# Patient Record
Sex: Female | Born: 1965 | Race: White | Hispanic: No | Smoking: Current some day smoker
Health system: Southern US, Community
[De-identification: ages and names within clinical notes are randomized; demographics above are authoritative.]

## PROBLEM LIST (undated history)

## (undated) DIAGNOSIS — Z8619 Personal history of other infectious and parasitic diseases: Secondary | ICD-10-CM

## (undated) DIAGNOSIS — F419 Anxiety disorder, unspecified: Secondary | ICD-10-CM

## (undated) DIAGNOSIS — T7840XA Allergy, unspecified, initial encounter: Secondary | ICD-10-CM

## (undated) DIAGNOSIS — K219 Gastro-esophageal reflux disease without esophagitis: Secondary | ICD-10-CM

## (undated) HISTORY — DX: Anxiety disorder, unspecified: F41.9

## (undated) HISTORY — DX: Allergy, unspecified, initial encounter: T78.40XA

## (undated) HISTORY — DX: Personal history of other infectious and parasitic diseases: Z86.19

## (undated) HISTORY — DX: Gastro-esophageal reflux disease without esophagitis: K21.9

---

## 2015-11-11 ENCOUNTER — Ambulatory Visit (INDEPENDENT_AMBULATORY_CARE_PROVIDER_SITE_OTHER): Payer: Managed Care, Other (non HMO) | Admitting: Internal Medicine

## 2015-11-11 ENCOUNTER — Encounter: Payer: Self-pay | Admitting: Internal Medicine

## 2015-11-11 VITALS — BP 128/70 | HR 78 | Temp 98.8°F | Ht 68.5 in | Wt 159.5 lb

## 2015-11-11 DIAGNOSIS — F419 Anxiety disorder, unspecified: Secondary | ICD-10-CM

## 2015-11-11 DIAGNOSIS — F329 Major depressive disorder, single episode, unspecified: Secondary | ICD-10-CM

## 2015-11-11 DIAGNOSIS — J302 Other seasonal allergic rhinitis: Secondary | ICD-10-CM | POA: Diagnosis not present

## 2015-11-11 DIAGNOSIS — Z114 Encounter for screening for human immunodeficiency virus [HIV]: Secondary | ICD-10-CM | POA: Diagnosis not present

## 2015-11-11 DIAGNOSIS — Z0000 Encounter for general adult medical examination without abnormal findings: Secondary | ICD-10-CM

## 2015-11-11 DIAGNOSIS — Z23 Encounter for immunization: Secondary | ICD-10-CM | POA: Diagnosis not present

## 2015-11-11 DIAGNOSIS — F32A Depression, unspecified: Secondary | ICD-10-CM

## 2015-11-11 DIAGNOSIS — K219 Gastro-esophageal reflux disease without esophagitis: Secondary | ICD-10-CM

## 2015-11-11 DIAGNOSIS — F418 Other specified anxiety disorders: Secondary | ICD-10-CM

## 2015-11-11 DIAGNOSIS — F411 Generalized anxiety disorder: Secondary | ICD-10-CM | POA: Insufficient documentation

## 2015-11-11 LAB — COMPREHENSIVE METABOLIC PANEL
ALT: 9 U/L (ref 0–35)
AST: 15 U/L (ref 0–37)
Albumin: 4.2 g/dL (ref 3.5–5.2)
Alkaline Phosphatase: 41 U/L (ref 39–117)
BUN: 10 mg/dL (ref 6–23)
CHLORIDE: 105 meq/L (ref 96–112)
CO2: 28 mEq/L (ref 19–32)
CREATININE: 0.81 mg/dL (ref 0.40–1.20)
Calcium: 9.4 mg/dL (ref 8.4–10.5)
GFR: 79.08 mL/min (ref >60.00)
GLUCOSE: 94 mg/dL (ref 70–99)
POTASSIUM: 4.2 meq/L (ref 3.5–5.1)
SODIUM: 137 meq/L (ref 135–145)
Total Bilirubin: 1.2 mg/dL (ref 0.2–1.2)
Total Protein: 7.3 g/dL (ref 6.0–8.3)

## 2015-11-11 LAB — CBC
HEMATOCRIT: 44.2 % (ref 36.0–46.0)
Hemoglobin: 14.7 g/dL (ref 12.0–15.0)
MCHC: 33.2 g/dL (ref 30.0–36.0)
MCV: 102.2 fl — AB (ref 78.0–100.0)
Platelets: 249 10*3/uL (ref 150.0–400.0)
RBC: 4.33 Mil/uL (ref 3.87–5.11)
RDW: 14.7 % (ref 11.5–15.5)
WBC: 5.9 10*3/uL (ref 4.0–10.5)

## 2015-11-11 LAB — LIPID PANEL
CHOL/HDL RATIO: 3
Cholesterol: 161 mg/dL (ref 0–200)
HDL: 56.9 mg/dL (ref >39.00)
LDL CALC: 96 mg/dL (ref 0–99)
NONHDL: 104.01
Triglycerides: 42 mg/dL (ref 0.0–149.0)
VLDL: 8.4 mg/dL (ref 0.0–40.0)

## 2015-11-11 MED ORDER — SERTRALINE HCL 25 MG PO TABS: 25.0000 mg | ORAL_TABLET | Freq: Every day | ORAL | 11 refills | Status: DC

## 2015-11-11 NOTE — Progress Notes (Signed)
HPI  Pt presents to the clinic today to establish care and for management of the conditions listed below. She is transferring care from her GYN Dr. Barnabas ListerWashington. She would like her annual exam today if she can get it.  Seasonal Allergies: Worse in the fall. She takes generic Allegra as needed with good relief.  Anxiety: Triggered by stress of life in general. She has taken Zoloft in the past, and feels like she needs to restart it at this time.  GERD: Triggered by spicy foods. She takes Tums as needed with good relief.  Flu: never Tetanus: > 10 years ago Pap Smear: > 5 years ago Mammogram: never Colon Screening: never Vision Screening: as needed Dentist: as needed  Past Medical History:  Diagnosis Date  . Allergy   . Anxiety   . GERD (gastroesophageal reflux disease)   . History of chicken pox     No current outpatient prescriptions on file.   No current facility-administered medications for this visit.     Allergies  Allergen Reactions  . Codeine Nausea And Vomiting    Family History  Problem Relation Age of Onset  . Alzheimer's disease Mother   . COPD Father   . Thyroid disease Sister   . Stroke Maternal Grandmother     Social History   Social History  . Marital status: Married    Spouse name: N/A  . Number of children: N/A  . Years of education: N/A   Occupational History  . Not on file.   Social History Main Topics  . Smoking status: Current Some Day Smoker    Packs/day: 0.25  . Smokeless tobacco: Never Used  . Alcohol use Yes     Comment: occasional  . Drug use: No  . Sexual activity: Yes   Other Topics Concern  . Not on file   Social History Narrative  . No narrative on file    ROS:  Constitutional: Denies fever, malaise, fatigue, headache or abrupt weight changes.  HEENT: Denies eye pain, eye redness, ear pain, ringing in the ears, wax buildup, runny nose, nasal congestion, bloody nose, or sore throat. Respiratory: Denies difficulty  breathing, shortness of breath, cough or sputum production.   Cardiovascular: Denies chest pain, chest tightness, palpitations or swelling in the hands or feet.  Gastrointestinal: Denies abdominal pain, bloating, constipation, diarrhea or blood in the stool.  GU: Denies frequency, urgency, pain with urination, blood in urine, odor or discharge. Musculoskeletal: Denies decrease in range of motion, difficulty with gait, muscle pain or joint pain and swelling.  Skin: Denies redness, rashes, lesions or ulcercations.  Neurological: Denies dizziness, difficulty with memory, difficulty with speech or problems with balance and coordination.  Psych: Pt reports mild anxiety and depression. Denies SI/HI.  No other specific complaints in a complete review of systems (except as listed in HPI above).  PE:  BP 128/70   Pulse 78   Temp 98.8 F (37.1 C) (Oral)   Ht 5' 8.5" (1.74 m)   Wt 159 lb 8 oz (72.3 kg)   LMP 10/19/2015 (Exact Date)   SpO2 98%   BMI 23.90 kg/m   Wt Readings from Last 3 Encounters:  No data found for Wt    General: Appears her stated age, well developed, well nourished in NAD. HEENT: Head: normal shape and size; Eyes: sclera white, no icterus, conjunctiva pink, PERRLA and EOMs intact; Ears: Tm's gray and intact, normal light reflex; Throat/Mouth: Teeth present, mucosa pink and moist, no lesions or ulcerations  noted.  Neck: Neck supple, trachea midline. No masses, lumps or thyromegaly present.  Cardiovascular: Normal rate and rhythm. S1,S2 noted.  No murmur, rubs or gallops noted. No JVD or BLE edema. No carotid bruits noted. Pulmonary/Chest: Normal effort and positive vesicular breath sounds. No respiratory distress. No wheezes, rales or ronchi noted.  Abdomen: Soft and nontender. Normal bowel sounds. No distention or masses noted. Liver, spleen and kidneys non palpable. Musculoskeletal: Normal range of motion. Strength 5/5 BUE/BLE. No signs of joint swelling. No difficulty with  gait.  Neurological: Alert and oriented. Cranial nerves II-XII grossly intact. Coordination normal.  Psychiatric: Mood and affect normal. Behavior is normal. Judgment and thought content normal.     Assessment and Plan:  Preventative Health Maintenance:  Encouraged her to get a flu shot in the fall Td today Mammogram ordered- she will call [facility] to schedule Will make an appt for her pap smear She declines colonoscopy, IFOB or Cologuard Encouraged her to consume a balanced diet and exercise regimen Encouraged her to see an eye doctor and dentist annually Discussed smoking cessation- she declines at this time Will check CBC, CMET, Lipid and HIV today  RTC in 1 year, sooner if needed Nicki ReaperBAITY, REGINA, NP

## 2015-11-11 NOTE — Assessment & Plan Note (Signed)
Avoid triggers Continue Tums as needed 

## 2015-11-11 NOTE — Progress Notes (Signed)
Pre visit review using our clinic review tool, if applicable. No additional management support is needed unless otherwise documented below in the visit note.   [facility] [place]... Flu--never... TD--more than 10 years... Pap--more than 5 yrs... Mamm--never... Colon--never... Vision--PRN... Dentist--PRN.Marland Kitchen..Marland Kitchen

## 2015-11-11 NOTE — Assessment & Plan Note (Signed)
Continue Allegra prn

## 2015-11-11 NOTE — Patient Instructions (Signed)
Health Maintenance, Female Adopting a healthy lifestyle and getting preventive care can go a long way to promote health and wellness. Talk with your health care provider about what schedule of regular examinations is right for you. This is a good chance for you to check in with your provider about disease prevention and staying healthy. In between checkups, there are plenty of things you can do on your own. Experts have done a lot of research about which lifestyle changes and preventive measures are most likely to keep you healthy. Ask your health care provider for more information. WEIGHT AND DIET  Eat a healthy diet  Be sure to include plenty of vegetables, fruits, low-fat dairy products, and lean protein.  Do not eat a lot of foods high in solid fats, added sugars, or salt.  Get regular exercise. This is one of the most important things you can do for your health.  Most adults should exercise for at least 150 minutes each week. The exercise should increase your heart rate and make you sweat (moderate-intensity exercise).  Most adults should also do strengthening exercises at least twice a week. This is in addition to the moderate-intensity exercise.  Maintain a healthy weight  Body mass index (BMI) is a measurement that can be used to identify possible weight problems. It estimates body fat based on height and weight. Your health care provider can help determine your BMI and help you achieve or maintain a healthy weight.  For females 20 years of age and older:   A BMI below 18.5 is considered underweight.  A BMI of 18.5 to 24.9 is normal.  A BMI of 25 to 29.9 is considered overweight.  A BMI of 30 and above is considered obese.  Watch levels of cholesterol and blood lipids  You should start having your blood tested for lipids and cholesterol at 51 years of age, then have this test every 5 years.  You may need to have your cholesterol levels checked more often if:  Your lipid  or cholesterol levels are high.  You are older than 50 years of age.  You are at high risk for heart disease.  CANCER SCREENING   Lung Cancer  Lung cancer screening is recommended for adults 55-80 years old who are at high risk for lung cancer because of a history of smoking.  A yearly low-dose CT scan of the lungs is recommended for people who:  Currently smoke.  Have quit within the past 15 years.  Have at least a 30-pack-year history of smoking. A pack year is smoking an average of one pack of cigarettes a day for 1 year.  Yearly screening should continue until it has been 15 years since you quit.  Yearly screening should stop if you develop a health problem that would prevent you from having lung cancer treatment.  Breast Cancer  Practice breast self-awareness. This means understanding how your breasts normally appear and feel.  It also means doing regular breast self-exams. Let your health care provider know about any changes, no matter how small.  If you are in your 20s or 30s, you should have a clinical breast exam (CBE) by a health care provider every 1-3 years as part of a regular health exam.  If you are 40 or older, have a CBE every year. Also consider having a breast X-ray (mammogram) every year.  If you have a family history of breast cancer, talk to your health care provider about genetic screening.  If you   are at high risk for breast cancer, talk to your health care provider about having an MRI and a mammogram every year.  Breast cancer gene (BRCA) assessment is recommended for women who have family members with BRCA-related cancers. BRCA-related cancers include:  Breast.  Ovarian.  Tubal.  Peritoneal cancers.  Results of the assessment will determine the need for genetic counseling and BRCA1 and BRCA2 testing. Cervical Cancer Your health care provider may recommend that you be screened regularly for cancer of the pelvic organs (ovaries, uterus, and  vagina). This screening involves a pelvic examination, including checking for microscopic changes to the surface of your cervix (Pap test). You may be encouraged to have this screening done every 3 years, beginning at age 21.  For women ages 30-65, health care providers may recommend pelvic exams and Pap testing every 3 years, or they may recommend the Pap and pelvic exam, combined with testing for human papilloma virus (HPV), every 5 years. Some types of HPV increase your risk of cervical cancer. Testing for HPV may also be done on women of any age with unclear Pap test results.  Other health care providers may not recommend any screening for nonpregnant women who are considered low risk for pelvic cancer and who do not have symptoms. Ask your health care provider if a screening pelvic exam is right for you.  If you have had past treatment for cervical cancer or a condition that could lead to cancer, you need Pap tests and screening for cancer for at least 20 years after your treatment. If Pap tests have been discontinued, your risk factors (such as having a new sexual partner) need to be reassessed to determine if screening should resume. Some women have medical problems that increase the chance of getting cervical cancer. In these cases, your health care provider may recommend more frequent screening and Pap tests. Colorectal Cancer  This type of cancer can be detected and often prevented.  Routine colorectal cancer screening usually begins at 50 years of age and continues through 51 years of age.  Your health care provider may recommend screening at an earlier age if you have risk factors for colon cancer.  Your health care provider may also recommend using home test kits to check for hidden blood in the stool.  A small camera at the end of a tube can be used to examine your colon directly (sigmoidoscopy or colonoscopy). This is done to check for the earliest forms of colorectal  cancer.  Routine screening usually begins at age 50.  Direct examination of the colon should be repeated every 5-10 years through 51 years of age. However, you may need to be screened more often if early forms of precancerous polyps or small growths are found. Skin Cancer  Check your skin from head to toe regularly.  Tell your health care provider about any new moles or changes in moles, especially if there is a change in a mole's shape or color.  Also tell your health care provider if you have a mole that is larger than the size of a pencil eraser.  Always use sunscreen. Apply sunscreen liberally and repeatedly throughout the day.  Protect yourself by wearing long sleeves, pants, a wide-brimmed hat, and sunglasses whenever you are outside. HEART DISEASE, DIABETES, AND HIGH BLOOD PRESSURE   High blood pressure causes heart disease and increases the risk of stroke. High blood pressure is more likely to develop in:  People who have blood pressure in the high end   of the normal range (130-139/85-89 mm Hg).  People who are overweight or obese.  People who are African American.  If you are 38-23 years of age, have your blood pressure checked every 3-5 years. If you are 61 years of age or older, have your blood pressure checked every year. You should have your blood pressure measured twice--once when you are at a hospital or clinic, and once when you are not at a hospital or clinic. Record the average of the two measurements. To check your blood pressure when you are not at a hospital or clinic, you can use:  An automated blood pressure machine at a pharmacy.  A home blood pressure monitor.  If you are between 45 years and 39 years old, ask your health care provider if you should take aspirin to prevent strokes.  Have regular diabetes screenings. This involves taking a blood sample to check your fasting blood sugar level.  If you are at a normal weight and have a low risk for diabetes,  have this test once every three years after 51 years of age.  If you are overweight and have a high risk for diabetes, consider being tested at a younger age or more often. PREVENTING INFECTION  Hepatitis B  If you have a higher risk for hepatitis B, you should be screened for this virus. You are considered at high risk for hepatitis B if:  You were born in a country where hepatitis B is common. Ask your health care provider which countries are considered high risk.  Your parents were born in a high-risk country, and you have not been immunized against hepatitis B (hepatitis B vaccine).  You have HIV or AIDS.  You use needles to inject street drugs.  You live with someone who has hepatitis B.  You have had sex with someone who has hepatitis B.  You get hemodialysis treatment.  You take certain medicines for conditions, including cancer, organ transplantation, and autoimmune conditions. Hepatitis C  Blood testing is recommended for:  Everyone born from [birth year] through 1966.  Anyone with known risk factors for hepatitis C. Sexually transmitted infections (STIs)  You should be screened for sexually transmitted infections (STIs) including gonorrhea and chlamydia if:  You are sexually active and are younger than 51 years of age.  You are older than 51 years of age and your health care provider tells you that you are at risk for this type of infection.  Your sexual activity has changed since you were last screened and you are at an increased risk for chlamydia or gonorrhea. Ask your health care provider if you are at risk.  If you do not have HIV, but are at risk, it may be recommended that you take a prescription medicine daily to prevent HIV infection. This is called pre-exposure prophylaxis (PrEP). You are considered at risk if:  You are sexually active and do not regularly use condoms or know the HIV status of your partner(s).  You take drugs by injection.  You are sexually  active with a partner who has HIV. Talk with your health care provider about whether you are at high risk of being infected with HIV. If you choose to begin PrEP, you should first be tested for HIV. You should then be tested every 3 months for as long as you are taking PrEP.  PREGNANCY   If you are premenopausal and you may become pregnant, ask your health care provider about preconception counseling.  If you may  become pregnant, take 400 to 800 micrograms (mcg) of folic acid every day.  If you want to prevent pregnancy, talk to your health care provider about birth control (contraception). OSTEOPOROSIS AND MENOPAUSE   Osteoporosis is a disease in which the bones lose minerals and strength with aging. This can result in serious bone fractures. Your risk for osteoporosis can be identified using a bone density scan.  If you are 61 years of age or older, or if you are at risk for osteoporosis and fractures, ask your health care provider if you should be screened.  Ask your health care provider whether you should take a calcium or vitamin D supplement to lower your risk for osteoporosis.  Menopause may have certain physical symptoms and risks.  Hormone replacement therapy may reduce some of these symptoms and risks. Talk to your health care provider about whether hormone replacement therapy is right for you.  HOME CARE INSTRUCTIONS   Schedule regular health, dental, and eye exams.  Stay current with your immunizations.   Do not use any tobacco products including cigarettes, chewing tobacco, or electronic cigarettes.  If you are pregnant, do not drink alcohol.  If you are breastfeeding, limit how much and how often you drink alcohol.  Limit alcohol intake to no more than 1 drink per day for nonpregnant women. One drink equals 12 ounces of beer, 5 ounces of wine, or 1 ounces of hard liquor.  Do not use street drugs.  Do not share needles.  Ask your health care provider for help if  you need support or information about quitting drugs.  Tell your health care provider if you often feel depressed.  Tell your health care provider if you have ever been abused or do not feel safe at home.   This information is not intended to replace advice given to you by your health care provider. Make sure you discuss any questions you have with your health care provider.   Document Released: 09/26/2010 Document Revised: 04/03/2014 Document Reviewed: 02/12/2013 Elsevier Interactive Patient Education [employer].

## 2015-11-11 NOTE — Assessment & Plan Note (Signed)
Mild Support offered today Will restart Zoloft

## 2015-11-12 LAB — HIV ANTIBODY (ROUTINE TESTING W REFLEX): HIV 1&2 Ab, 4th Generation: NONREACTIVE

## 2015-11-17 NOTE — Addendum Note (Signed)
Addended by: Roena MaladyEVONTENNO, Avanelle Pixley Y on: 11/17/2015 10:02 AM   Modules accepted: Orders

## 2015-12-08 ENCOUNTER — Other Ambulatory Visit: Payer: Self-pay

## 2015-12-08 MED ORDER — SERTRALINE HCL 25 MG PO TABS: 25.0000 mg | ORAL_TABLET | Freq: Every day | ORAL | 3 refills | Status: DC

## 2015-12-08 NOTE — Telephone Encounter (Signed)
Received a fax from [facility] to change the 11-11-15 rx from #0/11 to 90/3. New rx sent and requested they void out the previous rx

## 2015-12-17 ENCOUNTER — Other Ambulatory Visit: Payer: Self-pay | Admitting: Internal Medicine

## 2015-12-17 DIAGNOSIS — Z1231 Encounter for screening mammogram for malignant neoplasm of breast: Secondary | ICD-10-CM

## 2016-01-06 ENCOUNTER — Encounter: Payer: Self-pay | Admitting: Radiology

## 2016-01-06 ENCOUNTER — Ambulatory Visit
Admission: RE | Admit: 2016-01-06 | Discharge: 2016-01-06 | Disposition: A | Discharge status: Home / Self Care | Payer: Managed Care, Other (non HMO) | Source: Ambulatory Visit | Attending: Internal Medicine | Admitting: Internal Medicine

## 2016-01-06 DIAGNOSIS — Z1231 Encounter for screening mammogram for malignant neoplasm of breast: Secondary | ICD-10-CM | POA: Diagnosis present

## 2016-05-25 ENCOUNTER — Ambulatory Visit: Payer: Managed Care, Other (non HMO) | Admitting: Internal Medicine

## 2017-01-03 ENCOUNTER — Other Ambulatory Visit: Payer: Self-pay | Admitting: Internal Medicine

## 2017-01-03 DIAGNOSIS — Z1231 Encounter for screening mammogram for malignant neoplasm of breast: Secondary | ICD-10-CM

## 2017-01-21 ENCOUNTER — Other Ambulatory Visit: Payer: Self-pay | Admitting: Internal Medicine

## 2017-01-23 NOTE — Telephone Encounter (Signed)
Overdue CPE letter mailed 

## 2017-02-12 ENCOUNTER — Encounter (HOSPITAL_COMMUNITY): Payer: Self-pay

## 2017-02-12 ENCOUNTER — Ambulatory Visit
Admission: RE | Admit: 2017-02-12 | Discharge: 2017-02-12 | Disposition: A | Discharge status: Home / Self Care | Payer: Managed Care, Other (non HMO) | Source: Ambulatory Visit | Attending: Internal Medicine | Admitting: Internal Medicine

## 2017-02-12 DIAGNOSIS — Z1231 Encounter for screening mammogram for malignant neoplasm of breast: Secondary | ICD-10-CM

## 2017-02-28 ENCOUNTER — Other Ambulatory Visit: Payer: Self-pay | Admitting: Internal Medicine

## 2017-03-29 ENCOUNTER — Other Ambulatory Visit: Payer: Self-pay

## 2017-03-29 MED ORDER — SERTRALINE HCL 25 MG PO TABS: 25.0000 mg | ORAL_TABLET | Freq: Every day | ORAL | 0 refills | Status: DC

## 2017-04-04 ENCOUNTER — Encounter: Payer: Managed Care, Other (non HMO) | Admitting: Internal Medicine

## 2017-05-08 ENCOUNTER — Encounter: Payer: Self-pay | Admitting: Internal Medicine

## 2017-05-08 ENCOUNTER — Ambulatory Visit (INDEPENDENT_AMBULATORY_CARE_PROVIDER_SITE_OTHER): Payer: Managed Care, Other (non HMO) | Admitting: Internal Medicine

## 2017-05-08 ENCOUNTER — Other Ambulatory Visit (HOSPITAL_COMMUNITY)
Admission: RE | Admit: 2017-05-08 | Discharge: 2017-05-08 | Disposition: A | Discharge status: Home / Self Care | Payer: Managed Care, Other (non HMO) | Source: Ambulatory Visit | Attending: Internal Medicine | Admitting: Internal Medicine

## 2017-05-08 ENCOUNTER — Encounter: Payer: Managed Care, Other (non HMO) | Admitting: Internal Medicine

## 2017-05-08 VITALS — BP 104/70 | HR 78 | Temp 98.2°F | Ht 68.25 in | Wt 158.5 lb

## 2017-05-08 DIAGNOSIS — Z0000 Encounter for general adult medical examination without abnormal findings: Secondary | ICD-10-CM | POA: Diagnosis not present

## 2017-05-08 DIAGNOSIS — Z1151 Encounter for screening for human papillomavirus (HPV): Secondary | ICD-10-CM | POA: Insufficient documentation

## 2017-05-08 DIAGNOSIS — K219 Gastro-esophageal reflux disease without esophagitis: Secondary | ICD-10-CM

## 2017-05-08 DIAGNOSIS — Z124 Encounter for screening for malignant neoplasm of cervix: Secondary | ICD-10-CM

## 2017-05-08 DIAGNOSIS — E559 Vitamin D deficiency, unspecified: Secondary | ICD-10-CM | POA: Diagnosis not present

## 2017-05-08 DIAGNOSIS — F411 Generalized anxiety disorder: Secondary | ICD-10-CM

## 2017-05-08 LAB — LIPID PANEL
CHOLESTEROL: 204 mg/dL — AB (ref 0–200)
HDL: 57 mg/dL (ref >39.00)
LDL CALC: 122 mg/dL — AB (ref 0–99)
NonHDL: 147.21
Total CHOL/HDL Ratio: 4
Triglycerides: 124 mg/dL (ref 0.0–149.0)
VLDL: 24.8 mg/dL (ref 0.0–40.0)

## 2017-05-08 LAB — COMPREHENSIVE METABOLIC PANEL
ALBUMIN: 4 g/dL (ref 3.5–5.2)
ALT: 9 U/L (ref 0–35)
AST: 15 U/L (ref 0–37)
Alkaline Phosphatase: 43 U/L (ref 39–117)
BILIRUBIN TOTAL: 0.5 mg/dL (ref 0.2–1.2)
BUN: 14 mg/dL (ref 6–23)
CHLORIDE: 103 meq/L (ref 96–112)
CO2: 32 mEq/L (ref 19–32)
CREATININE: 0.83 mg/dL (ref 0.40–1.20)
Calcium: 9.3 mg/dL (ref 8.4–10.5)
GFR: 76.44 mL/min (ref >60.00)
Glucose, Bld: 90 mg/dL (ref 70–99)
Potassium: 4.8 mEq/L (ref 3.5–5.1)
Sodium: 139 mEq/L (ref 135–145)
Total Protein: 6.9 g/dL (ref 6.0–8.3)

## 2017-05-08 LAB — CBC
HEMATOCRIT: 43.2 % (ref 36.0–46.0)
HEMOGLOBIN: 14.4 g/dL (ref 12.0–15.0)
MCHC: 33.4 g/dL (ref 30.0–36.0)
MCV: 102 fl — AB (ref 78.0–100.0)
PLATELETS: 227 10*3/uL (ref 150.0–400.0)
RBC: 4.24 Mil/uL (ref 3.87–5.11)
RDW: 14 % (ref 11.5–15.5)
WBC: 6.7 10*3/uL (ref 4.0–10.5)

## 2017-05-08 LAB — VITAMIN D 25 HYDROXY (VIT D DEFICIENCY, FRACTURES): VITD: 10.7 ng/mL — ABNORMAL LOW (ref 30.00–100.00)

## 2017-05-08 NOTE — Assessment & Plan Note (Signed)
Controlled with TUMS prn

## 2017-05-08 NOTE — Patient Instructions (Signed)

## 2017-05-08 NOTE — Assessment & Plan Note (Signed)
Controlled on Sertraline Support offered today

## 2017-05-08 NOTE — Progress Notes (Signed)
Subjective:    Patient ID: Joan Ramos, female    DOB: 1965/06/11, 52 y.o.   MRN: [id number]  HPI  Pt presents to the clinic today for her annual exam. She is also due to follow up chronic conditions.  GERD: Intermittent. Triggered by spicy foods. She takes Tums as needed.  Anxiety: Chronic but stable on Sertraline. She denies depression, SI/HI.  Flu: 01/2017 Tetanus: 10/2015 Pap Smear: > 5 years ago Mammogram: 01/2017 Colon Screening: never Vision Screening: yearly Dentist: yearly  Diet: She is eat meat. She consumes fruits and veggies daily. She tries to avoid fried foods. She drinks mostly Dr. Reino KentPepper and [employer]. Exercise: None  Review of Systems      Past Medical History:  Diagnosis Date  . Allergy   . Anxiety   . GERD (gastroesophageal reflux disease)   . History of chicken pox     Current Outpatient Medications  Medication Sig Dispense Refill  . sertraline (ZOLOFT) 25 MG tablet Take 1 tablet (25 mg total) by mouth daily. 90 tablet 0   No current facility-administered medications for this visit.     Allergies  Allergen Reactions  . Codeine Nausea And Vomiting    Family History  Problem Relation Age of Onset  . Alzheimer's disease Mother   . COPD Father   . Thyroid disease Sister   . Stroke Maternal Grandmother   . Breast cancer Neg Hx     Social History   Socioeconomic History  . Marital status: Married    Spouse name: Not on file  . Number of children: Not on file  . Years of education: Not on file  . Highest education level: Not on file  Social Needs  . Financial resource strain: Not on file  . Food insecurity - worry: Not on file  . Food insecurity - inability: Not on file  . Transportation needs - medical: Not on file  . Transportation needs - non-medical: Not on file  Occupational History  . Not on file  Tobacco Use  . Smoking status: Current Some Day Smoker    Packs/day: 0.25    Years: 10.00    Pack years: 2.50  .  Smokeless tobacco: Never Used  Substance and Sexual Activity  . Alcohol use: Yes    Comment: occasional  . Drug use: No  . Sexual activity: Yes  Other Topics Concern  . Not on file  Social History Narrative  . Not on file     Constitutional: Denies fever, malaise, fatigue, headache or abrupt weight changes.  HEENT: Denies eye pain, eye redness, ear pain, ringing in the ears, wax buildup, runny nose, nasal congestion, bloody nose, or sore throat. Respiratory: Denies difficulty breathing, shortness of breath, cough or sputum production.   Cardiovascular: Denies chest pain, chest tightness, palpitations or swelling in the hands or feet.  Gastrointestinal: Pt reports intermittent reflux. Denies abdominal pain, bloating, constipation, diarrhea or blood in the stool.  GU: Denies urgency, frequency, pain with urination, burning sensation, blood in urine, odor or discharge. Musculoskeletal: Denies decrease in range of motion, difficulty with gait, muscle pain or joint pain and swelling.  Skin: Denies redness, rashes, lesions or ulcercations.  Neurological: Denies dizziness, difficulty with memory, difficulty with speech or problems with balance and coordination.  Psych: Pt reports anxiety. Denies depression, SI/HI.  No other specific complaints in a complete review of systems (except as listed in HPI above).  Objective:   Physical Exam   BP 104/70  Pulse 78   Temp 98.2 F (36.8 C) (Oral)   Ht 5' 8.25" (1.734 m)   Wt 158 lb 8 oz (71.9 kg)   LMP 04/03/2017   SpO2 98%   BMI 23.92 kg/m  Wt Readings from Last 3 Encounters:  05/08/17 158 lb 8 oz (71.9 kg)  11/11/15 159 lb 8 oz (72.3 kg)    General: Appears her stated age, well developed, well nourished in NAD. Skin: Warm, dry and intact.  HEENT: Head: normal shape and size; Eyes: sclera white, no icterus, conjunctiva pink, PERRLA and EOMs intact; Ears: Tm's gray and intact, normal light reflex; Throat/Mouth: Teeth present, mucosa  pink and moist, no exudate, lesions or ulcerations noted.  Neck:  Neck supple, trachea midline. No masses, lumps or thyromegaly present.  Cardiovascular: Normal rate and rhythm. S1,S2 noted.  No murmur, rubs or gallops noted. No JVD or BLE edema. No carotid bruits noted. Pulmonary/Chest: Normal effort and positive vesicular breath sounds. No respiratory distress. No wheezes, rales or ronchi noted.  Abdomen: Soft and nontender. Normal bowel sounds. No distention or masses noted. Liver, spleen and kidneys non palpable. Pelvic: Normal female anatomy. Cervix without changes. No CMT tenderness, Adnexa non palpable. Musculoskeletal: Strength 5/5 BUE/BLE. No difficulty with gait.  Neurological: Alert and oriented. Cranial nerves II-XII grossly intact. Coordination normal.  Psychiatric: Mood and affect normal. Behavior is normal. Judgment and thought content normal.    BMET    Component Value Date/Time   NA 137 11/11/2015 1022   K 4.2 11/11/2015 1022   CL 105 11/11/2015 1022   CO2 28 11/11/2015 1022   GLUCOSE 94 11/11/2015 1022   BUN 10 11/11/2015 1022   CREATININE 0.81 11/11/2015 1022   CALCIUM 9.4 11/11/2015 1022    Lipid Panel     Component Value Date/Time   CHOL 161 11/11/2015 1022   TRIG 42.0 11/11/2015 1022   HDL 56.90 11/11/2015 1022   CHOLHDL 3 11/11/2015 1022   VLDL 8.4 11/11/2015 1022   LDLCALC 96 11/11/2015 1022    CBC    Component Value Date/Time   WBC 5.9 11/11/2015 1022   RBC 4.33 11/11/2015 1022   HGB 14.7 11/11/2015 1022   HCT 44.2 11/11/2015 1022   PLT 249.0 11/11/2015 1022   MCV 102.2 (H) 11/11/2015 1022   MCHC 33.2 11/11/2015 1022   RDW 14.7 11/11/2015 1022    Hgb A1C No results found for: HGBA1C         Assessment & Plan:   Preventative Health Maintenance:  Flu shot UTD Tetanus UTD Pap smear today, she declines STD screening She will schedule her mammogram in 01/2018 She declines colonoscopy but will call her insurance to see if Cologuard is  covered Encouraged her to consume a balanced diet and exercise regimen Advised her to see an eye doctor and dentist annually Will check CBC, CMET, Lipid, and Vit D today  RTC in 1 year, sooner if needed Nicki Reaper, NP

## 2017-05-09 LAB — CYTOLOGY - PAP
Diagnosis: NEGATIVE
HPV: NOT DETECTED

## 2017-05-14 ENCOUNTER — Other Ambulatory Visit: Payer: Self-pay | Admitting: Internal Medicine

## 2017-05-14 DIAGNOSIS — E559 Vitamin D deficiency, unspecified: Secondary | ICD-10-CM

## 2017-05-14 MED ORDER — VITAMIN D (ERGOCALCIFEROL) 1.25 MG (50000 UNIT) PO CAPS: 50000.0000 [IU] | ORAL_CAPSULE | ORAL | 0 refills | Status: DC

## 2017-05-16 NOTE — Addendum Note (Signed)
Addended by: Roena MaladyEVONTENNO, MELANIE Y on: 05/16/2017 11:33 AM   Modules accepted: Orders

## 2017-07-25 ENCOUNTER — Other Ambulatory Visit: Payer: Self-pay | Admitting: Internal Medicine

## 2017-07-31 ENCOUNTER — Other Ambulatory Visit: Payer: Self-pay | Admitting: Internal Medicine

## 2017-08-09 ENCOUNTER — Other Ambulatory Visit: Payer: Self-pay | Admitting: Internal Medicine

## 2017-08-15 ENCOUNTER — Other Ambulatory Visit (INDEPENDENT_AMBULATORY_CARE_PROVIDER_SITE_OTHER): Payer: Managed Care, Other (non HMO)

## 2017-08-15 DIAGNOSIS — E559 Vitamin D deficiency, unspecified: Secondary | ICD-10-CM | POA: Diagnosis not present

## 2017-08-15 LAB — VITAMIN D 25 HYDROXY (VIT D DEFICIENCY, FRACTURES): VITD: 41.14 ng/mL (ref 30.00–100.00)

## 2018-02-06 ENCOUNTER — Other Ambulatory Visit: Payer: Self-pay | Admitting: Internal Medicine

## 2018-02-06 DIAGNOSIS — Z1231 Encounter for screening mammogram for malignant neoplasm of breast: Secondary | ICD-10-CM

## 2018-03-06 ENCOUNTER — Ambulatory Visit
Admission: RE | Admit: 2018-03-06 | Discharge: 2018-03-06 | Disposition: A | Discharge status: Home / Self Care | Payer: Managed Care, Other (non HMO) | Source: Ambulatory Visit | Attending: Internal Medicine | Admitting: Internal Medicine

## 2018-03-06 DIAGNOSIS — Z1231 Encounter for screening mammogram for malignant neoplasm of breast: Secondary | ICD-10-CM | POA: Diagnosis present

## 2018-06-04 ENCOUNTER — Other Ambulatory Visit: Payer: Self-pay | Admitting: Internal Medicine

## 2018-10-08 ENCOUNTER — Encounter: Payer: Self-pay | Admitting: Internal Medicine

## 2018-10-08 ENCOUNTER — Other Ambulatory Visit: Payer: Self-pay

## 2018-10-08 ENCOUNTER — Ambulatory Visit: Payer: PRIVATE HEALTH INSURANCE | Admitting: Internal Medicine

## 2018-10-08 VITALS — BP 104/70 | HR 88 | Temp 98.6°F | Wt 163.0 lb

## 2018-10-08 DIAGNOSIS — F411 Generalized anxiety disorder: Secondary | ICD-10-CM | POA: Diagnosis not present

## 2018-10-08 DIAGNOSIS — M79621 Pain in right upper arm: Secondary | ICD-10-CM

## 2018-10-08 MED ORDER — SERTRALINE HCL 25 MG PO TABS: 25.0000 mg | ORAL_TABLET | Freq: Every day | ORAL | 0 refills | Status: DC

## 2018-10-08 MED ORDER — METHOCARBAMOL 500 MG PO TABS: 250.0000 mg | ORAL_TABLET | Freq: Every evening | ORAL | 0 refills | Status: AC | PRN

## 2018-10-08 NOTE — Progress Notes (Signed)
Subjective:    Patient ID: Joan Ramos, female    DOB: [birth year], 53 y.o.   MRN: [id number]  HPI  Patient presents to the clinic today for a medication follow up for depression.      GERD:  Not currently an issue. She is not taking anything OTC for this.  Anxiety:  Currently taking Sertraline as prescribed.  Denies SI/HI.  She does not see a psychiatrist.  Vitamin D Deficiency:  She is currently taking Vitamin D supplementation OTC along with a daily women's Multivitamin.  She also complains of right arm pain x 2 weeks.  She describes the pain as throbbing and inhibits her sleep.  She has tried OTC muscle rub without any improvement.  She works in a [employer] and does repetitive work with her right arm. She denies any injury that she is aware of.   Review of Systems      Past Medical History:  Diagnosis Date  . Allergy   . Anxiety   . GERD (gastroesophageal reflux disease)   . History of chicken pox     Current Outpatient Medications  Medication Sig Dispense Refill  . sertraline (ZOLOFT) 25 MG tablet Take 1 tablet (25 mg total) by mouth daily. MUST SCHEDULE PHYSICAL EXAM 90 tablet 0  . Vitamin D, Ergocalciferol, (DRISDOL) 50000 units CAPS capsule Take 1 capsule (50,000 Units total) by mouth every 7 (seven) days. 12 capsule 0   No current facility-administered medications for this visit.     Allergies  Allergen Reactions  . Codeine Nausea And Vomiting    Family History  Problem Relation Age of Onset  . Alzheimer's disease Mother   . COPD Father   . Thyroid disease Sister   . Stroke Maternal Grandmother   . Breast cancer Neg Hx     Social History   Socioeconomic History  . Marital status: Married    Spouse name: Not on file  . Number of children: Not on file  . Years of education: Not on file  . Highest education level: Not on file  Occupational History  . Not on file  Social Needs  . Financial resource strain: Not on file  . Food insecurity   Worry: Not on file    Inability: Not on file  . Transportation needs    Medical: Not on file    Non-medical: Not on file  Tobacco Use  . Smoking status: Current Some Day Smoker    Packs/day: 0.25    Years: 10.00    Pack years: 2.50  . Smokeless tobacco: Never Used  Substance and Sexual Activity  . Alcohol use: Yes    Comment: occasional  . Drug use: No  . Sexual activity: Yes  Lifestyle  . Physical activity    Days per week: Not on file    Minutes per session: Not on file  . Stress: Not on file  Relationships  . Social Herbalist on phone: Not on file    Gets together: Not on file    Attends religious service: Not on file    Active member of club or organization: Not on file    Attends meetings of clubs or organizations: Not on file    Relationship status: Not on file  . Intimate partner violence    Fear of current or ex partner: Not on file    Emotionally abused: Not on file    Physically abused: Not on file    Forced sexual  activity: Not on file  Other Topics Concern  . Not on file  Social History Narrative  . Not on file     Constitutional: Denies fever, malaise, fatigue, headache or abrupt weight changes.  Musculoskeletal: Positive for right upper arm pain with movement.  She reports decreased ROM due to pain. Skin: Denies redness, rashes, lesions or ulcercations.  Psych: Has history of panic attacks.  Denies depression, SI/HI.  No other specific complaints in a complete review of systems (except as listed in HPI above).  Objective:   Physical Exam  BP 104/70 (BP Location: Left Arm, Patient Position: Sitting, Cuff Size: Normal)   Pulse 88   Temp 98.6 F (37 C)   Wt 163 lb (73.9 kg)   SpO2 97%   BMI 24.60 kg/m    Wt Readings from Last 3 Encounters:  05/08/17 158 lb 8 oz (71.9 kg)  11/11/15 159 lb 8 oz (72.3 kg)    General: Appears her stated age, well developed, well nourished in NAD. Skin: Warm, dry and intact. No rashes, lesions or  ulcerations noted. Musculoskeletal: Pain with ROM of RUE.  Pain with palpation of the right upper extremity. Psychiatric: Mood and affect normal. Behavior is normal. Judgment and thought content normal.    BMET    Component Value Date/Time   NA 139 05/08/2017 1435   K 4.8 05/08/2017 1435   CL 103 05/08/2017 1435   CO2 32 05/08/2017 1435   GLUCOSE 90 05/08/2017 1435   BUN 14 05/08/2017 1435   CREATININE 0.83 05/08/2017 1435   CALCIUM 9.3 05/08/2017 1435    Lipid Panel     Component Value Date/Time   CHOL 204 (H) 05/08/2017 1435   TRIG 124.0 05/08/2017 1435   HDL 57.00 05/08/2017 1435   CHOLHDL 4 05/08/2017 1435   VLDL 24.8 05/08/2017 1435   LDLCALC 122 (H) 05/08/2017 1435    CBC    Component Value Date/Time   WBC 6.7 05/08/2017 1435   RBC 4.24 05/08/2017 1435   HGB 14.4 05/08/2017 1435   HCT 43.2 05/08/2017 1435   PLT 227.0 05/08/2017 1435   MCV 102.0 (H) 05/08/2017 1435   MCHC 33.4 05/08/2017 1435   RDW 14.0 05/08/2017 1435    Hgb A1C No results found for: HGBA1C          Assessment & Plan:   Right Upper Extremity Pain:  Rx for Robaxin 250 mg PO QHS prn. Encouraged use of heat, Ibuprofen, and Tylenol for symptom management.  Anxiety:    Continue on current dose of Sertraline.  Refill sent to pharmacy.  Support offered today.  GERD:  Currently not an issue Will monitor  Vit D Def:  Continue Vit D OTC Will recheck Vit D level at annual exam  Return precautions discussed.  Nicki Reaperegina Liberta Gimpel, NP

## 2018-10-08 NOTE — Patient Instructions (Signed)
Muscle Strain A muscle strain is an injury that happens when a muscle is stretched longer than normal. This can happen during a fall, sports, or lifting. This can tear some muscle fibers. Usually, recovery from muscle strain takes 1-2 weeks. Complete healing normally takes 5-6 weeks. This condition is first treated with PRICE therapy. This involves:  Protecting your muscle from being injured again.  Resting your injured muscle.  Icing your injured muscle.  Applying pressure (compression) to your injured muscle. This may be done with a splint or elastic bandage.  Raising (elevating) your injured muscle. Your doctor may also recommend medicine for pain. Follow these instructions at home: If you have a splint:  Wear the splint as told by your doctor. Take it off only as told by your doctor.  Loosen the splint if your fingers or toes tingle, get numb, or turn cold and blue.  Keep the splint clean.  If the splint is not waterproof: ? Do not let it get wet. ? Cover it with a watertight covering when you take a bath or a shower. Managing pain, stiffness, and swelling   If directed, put ice on your injured area. ? If you have a removable splint, take it off as told by your doctor. ? Put ice in a plastic bag. ? Place a towel between your skin and the bag. ? Leave the ice on for 20 minutes, 2-3 times a day.  Move your fingers or toes often. This helps to avoid stiffness and lessen swelling.  Raise your injured area above the level of your heart while you are sitting or lying down.  Wear an elastic bandage as told by your doctor. Make sure it is not too tight. General instructions  Take over-the-counter and prescription medicines only as told by your doctor.  Limit your activity. Rest your injured muscle as told by your doctor. Your doctor may say that gentle movements are okay.  If physical therapy was prescribed, do exercises as told by your doctor.  Do not put pressure on any  part of the splint until it is fully hardened. This may take many hours.  Do not use any products that contain nicotine or tobacco, such as cigarettes and e-cigarettes. These can delay bone healing. If you need help quitting, ask your doctor.  Warm up before you exercise. This helps to prevent more muscle strains.  Ask your doctor when it is safe to drive if you have a splint.  Keep all follow-up visits as told by your doctor. This is important. Contact a doctor if:  You have more pain or swelling in your injured area. Get help right away if:  You have any of these problems in your injured area: ? You have numbness. ? You have tingling. ? You lose a lot of strength. Summary  A muscle strain is an injury that happens when a muscle is stretched longer than normal.  This condition is first treated with PRICE therapy. This includes protecting, resting, icing, adding pressure, and raising your injury.  Limit your activity. Rest your injured muscle as told by your doctor. Your doctor may say that gentle movements are okay.  Warm up before you exercise. This helps to prevent more muscle strains. This information is not intended to replace advice given to you by your health care provider. Make sure you discuss any questions you have with your health care provider. Document Released: 12/21/2007 Document Revised: 05/09/2018 Document Reviewed: 04/19/2016 Elsevier Patient Education  2020 [employer]  

## 2018-12-10 ENCOUNTER — Encounter: Payer: PRIVATE HEALTH INSURANCE | Admitting: Internal Medicine

## 2019-01-04 ENCOUNTER — Other Ambulatory Visit: Payer: Self-pay | Admitting: Internal Medicine

## 2019-01-09 ENCOUNTER — Encounter: Payer: PRIVATE HEALTH INSURANCE | Admitting: Internal Medicine

## 2019-02-17 IMAGING — MG DIGITAL SCREENING BILATERAL MAMMOGRAM WITH TOMO AND CAD
6 of 10 series · 6 of 30 positions shown · non-contrast
Comparison: Previous exam(s).

CLINICAL DATA: Screening.

EXAM:
DIGITAL SCREENING BILATERAL MAMMOGRAM WITH TOMO AND CAD

[L MLO synth-2D]
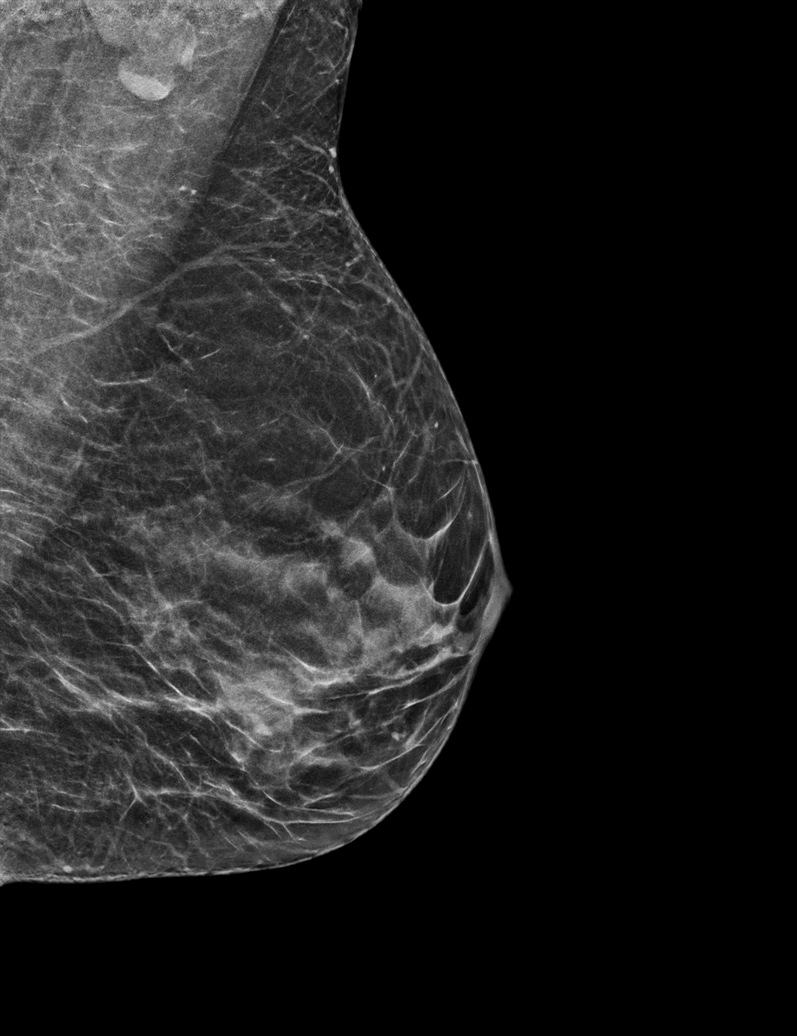

[R CC synth-2D (1 of 2)]
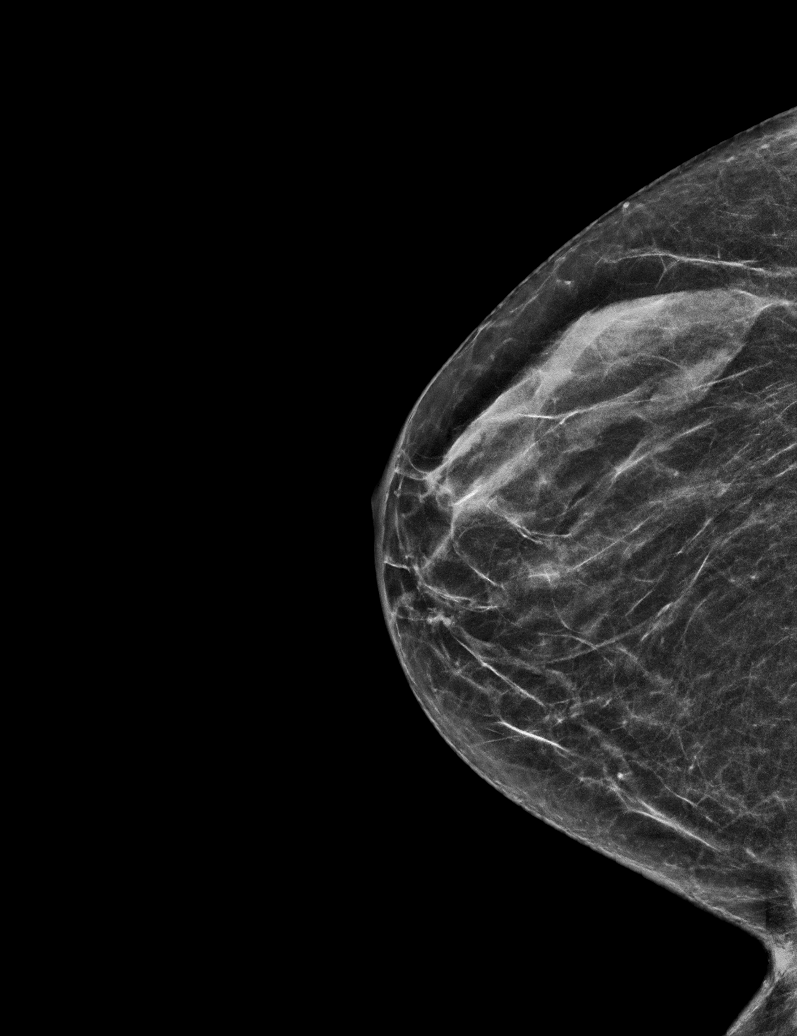

[L CC synth-2D]
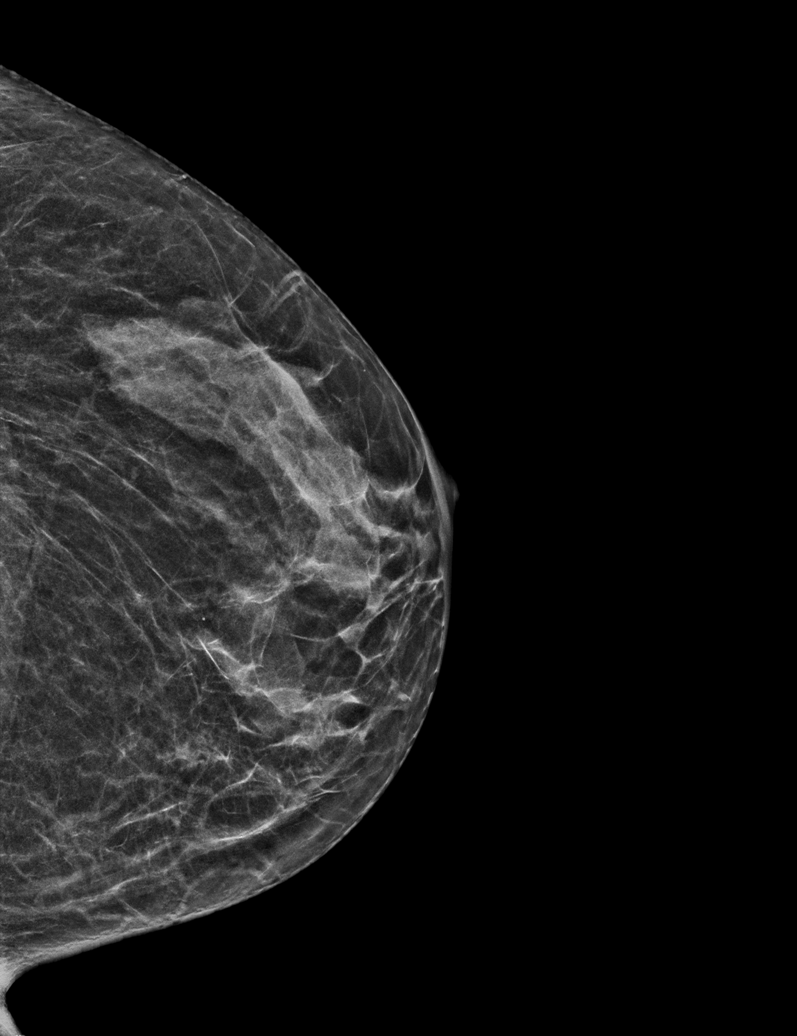

[R MLO synth-2D]
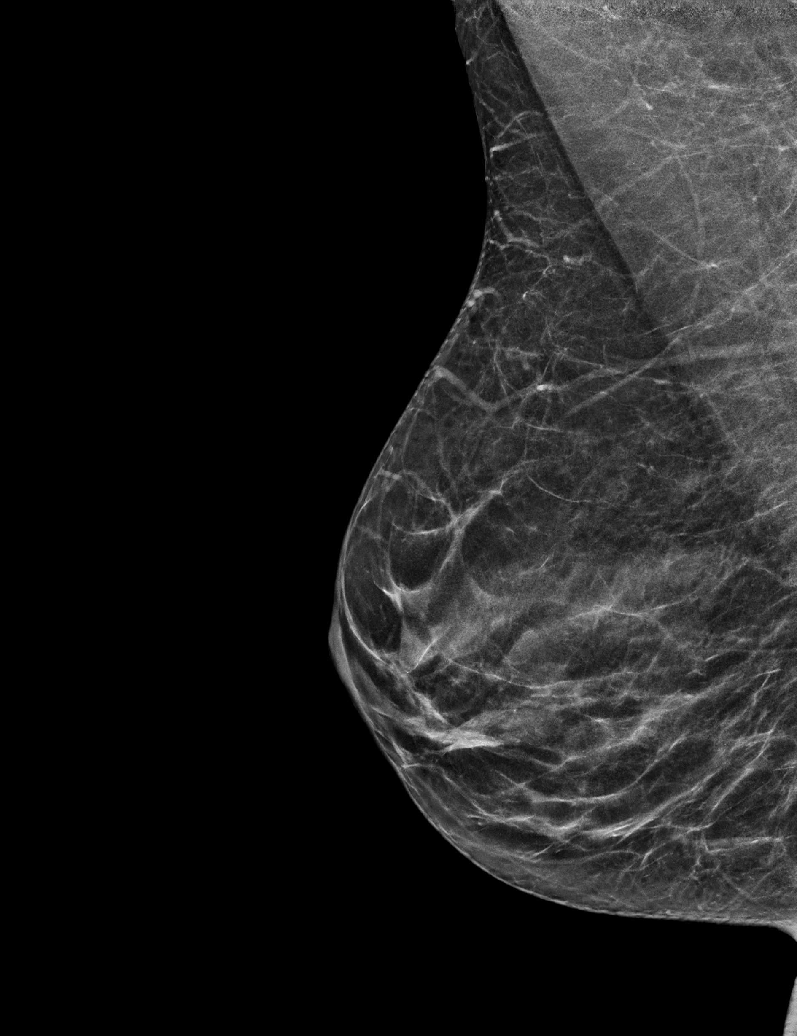

[R CC synth-2D (2 of 2)]
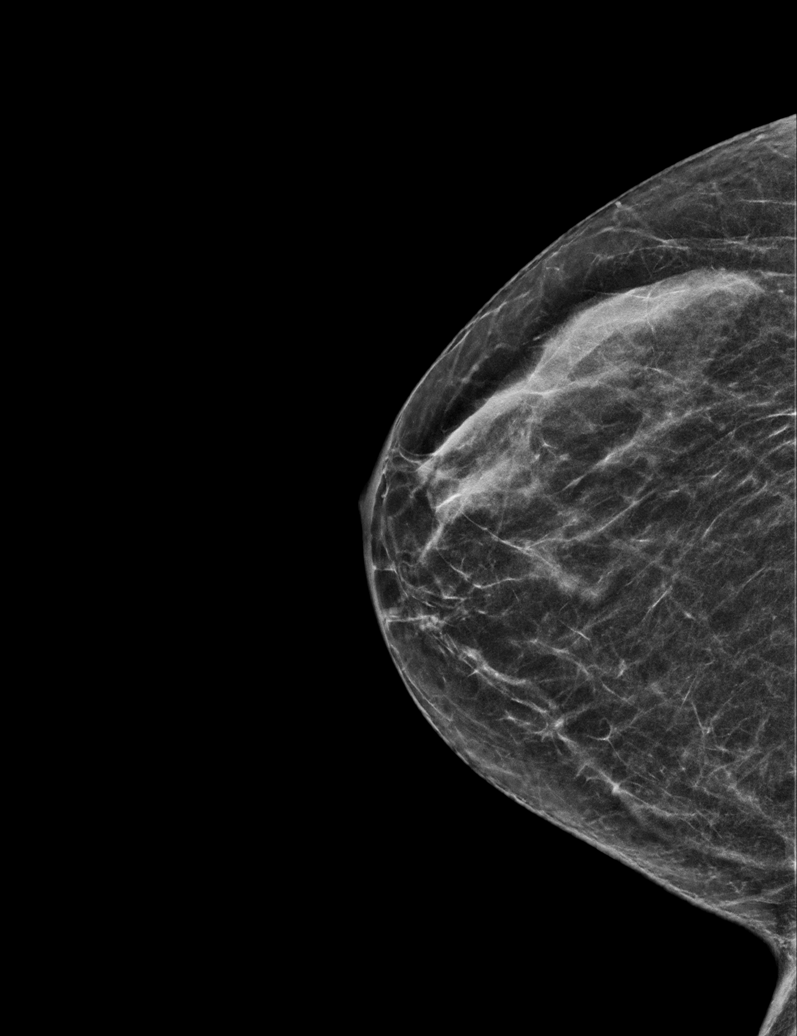

[R MLO tomo · tomo slice 23/46.0]
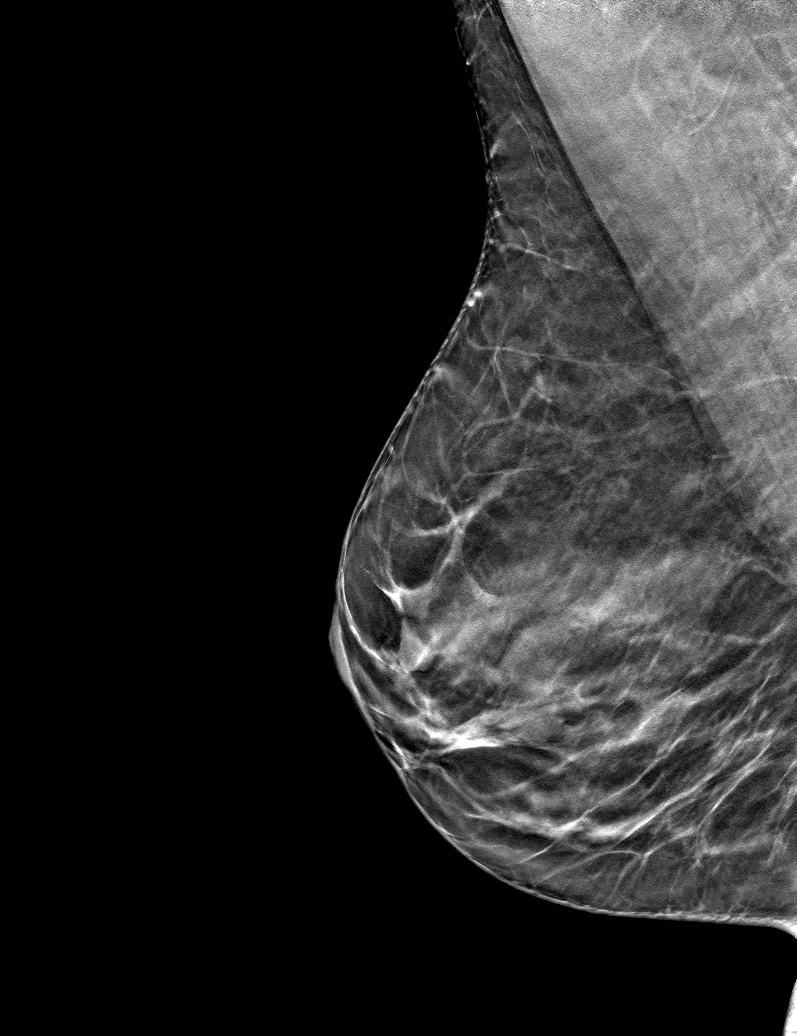

[6 of 30 positions shown; findings below may reference images not displayed]

ACR Breast Density Category b: There are scattered areas of
fibroglandular density.
FINDINGS: There are no findings suspicious for malignancy. Images were
processed with CAD.
IMPRESSION: No mammographic evidence of malignancy. A result letter of this
screening mammogram will be mailed directly to the patient.

RECOMMENDATION:
Screening mammogram in one year. (Code:[id number])

BI-RADS CATEGORY  1: Negative.

## 2019-03-30 ENCOUNTER — Other Ambulatory Visit: Payer: Self-pay | Admitting: Internal Medicine

## 2019-06-25 ENCOUNTER — Other Ambulatory Visit: Payer: Self-pay | Admitting: Internal Medicine

## 2019-07-19 ENCOUNTER — Ambulatory Visit: Payer: PRIVATE HEALTH INSURANCE | Attending: Internal Medicine

## 2019-07-19 DIAGNOSIS — Z23 Encounter for immunization: Secondary | ICD-10-CM

## 2019-07-19 NOTE — Progress Notes (Signed)
   Covid-19 Vaccination Clinic  Name:  IRELYNN SCHERMERHORN    MRN: [id number] DOB: May 21, 1965  07/19/2019  Ms. Blomberg was observed post Covid-19 immunization for 15 minutes without incident. She was provided with Vaccine Information Sheet and instruction to access the V-Safe system.   Ms. Frankenfield was instructed to call 911 with any severe reactions post vaccine: Marland Kitchen Difficulty breathing  . Swelling of face and throat  . A fast heartbeat  . A bad rash all over body  . Dizziness and weakness   Immunizations Administered    Name Date Dose VIS Date Route   Pfizer COVID-19 Vaccine 07/19/2019 11:44 AM 0.3 mL 05/21/2018 Intramuscular   Manufacturer: [employer], [employer]   Lot: [id number]   NDC: [address]-9

## 2019-08-12 ENCOUNTER — Ambulatory Visit: Payer: PRIVATE HEALTH INSURANCE | Attending: Internal Medicine

## 2019-08-12 DIAGNOSIS — Z23 Encounter for immunization: Secondary | ICD-10-CM

## 2019-08-12 NOTE — Progress Notes (Signed)
   Covid-19 Vaccination Clinic  Name:  Joan Ramos    MRN: [id number] DOB: February 03, 1966  08/12/2019  Joan Ramos was observed post Covid-19 immunization for 15 minutes without incident. She was provided with Vaccine Information Sheet and instruction to access the V-Safe system.   Joan Ramos was instructed to call 911 with any severe reactions post vaccine: Marland Kitchen Difficulty breathing  . Swelling of face and throat  . A fast heartbeat  . A bad rash all over body  . Dizziness and weakness   Immunizations Administered    Name Date Dose VIS Date Route   Pfizer COVID-19 Vaccine 08/12/2019  2:16 PM 0.3 mL 05/21/2018 Intramuscular   Manufacturer: [employer], [employer]   Lot: [id number]   NDC: [address]-4

## 2020-01-07 ENCOUNTER — Other Ambulatory Visit: Payer: Self-pay | Admitting: Internal Medicine

## 2020-01-07 DIAGNOSIS — Z1231 Encounter for screening mammogram for malignant neoplasm of breast: Secondary | ICD-10-CM

## 2020-02-10 ENCOUNTER — Ambulatory Visit
Admission: RE | Admit: 2020-02-10 | Discharge: 2020-02-10 | Disposition: A | Discharge status: Home / Self Care | Payer: PRIVATE HEALTH INSURANCE | Source: Ambulatory Visit | Attending: Internal Medicine | Admitting: Internal Medicine

## 2020-02-10 ENCOUNTER — Other Ambulatory Visit: Payer: Self-pay

## 2020-02-10 DIAGNOSIS — Z1231 Encounter for screening mammogram for malignant neoplasm of breast: Secondary | ICD-10-CM | POA: Insufficient documentation

## 2020-02-12 LAB — COLOGUARD: COLOGUARD: NEGATIVE

## 2021-02-07 ENCOUNTER — Other Ambulatory Visit: Payer: Self-pay | Admitting: Internal Medicine

## 2021-02-07 DIAGNOSIS — Z1231 Encounter for screening mammogram for malignant neoplasm of breast: Secondary | ICD-10-CM

## 2021-02-22 ENCOUNTER — Ambulatory Visit
Admission: RE | Admit: 2021-02-22 | Discharge: 2021-02-22 | Disposition: A | Discharge status: Home / Self Care | Payer: PRIVATE HEALTH INSURANCE | Source: Ambulatory Visit | Attending: Internal Medicine | Admitting: Internal Medicine

## 2021-02-22 ENCOUNTER — Other Ambulatory Visit: Payer: Self-pay

## 2021-02-22 DIAGNOSIS — Z1231 Encounter for screening mammogram for malignant neoplasm of breast: Secondary | ICD-10-CM | POA: Insufficient documentation

## 2022-04-14 ENCOUNTER — Other Ambulatory Visit: Payer: Self-pay | Admitting: Internal Medicine

## 2022-04-14 DIAGNOSIS — Z1231 Encounter for screening mammogram for malignant neoplasm of breast: Secondary | ICD-10-CM

## 2022-05-03 ENCOUNTER — Ambulatory Visit
Admission: RE | Admit: 2022-05-03 | Discharge: 2022-05-03 | Disposition: A | Discharge status: Home / Self Care | Payer: BC Managed Care – PPO | Source: Ambulatory Visit | Attending: Internal Medicine | Admitting: Internal Medicine

## 2022-05-03 DIAGNOSIS — Z1231 Encounter for screening mammogram for malignant neoplasm of breast: Secondary | ICD-10-CM | POA: Insufficient documentation

## 2023-12-26 ENCOUNTER — Encounter: Payer: Self-pay | Admitting: Internal Medicine
